# Patient Record
Sex: Female | Born: 1980 | Race: Black or African American | Hispanic: No | Marital: Single | Smoking: Current every day smoker
Health system: Southern US, Community
[De-identification: ages and names within clinical notes are randomized; demographics above are authoritative.]

## PROBLEM LIST (undated history)

## (undated) DIAGNOSIS — E119 Type 2 diabetes mellitus without complications: Secondary | ICD-10-CM

## (undated) DIAGNOSIS — C50919 Malignant neoplasm of unspecified site of unspecified female breast: Secondary | ICD-10-CM

## (undated) DIAGNOSIS — I10 Essential (primary) hypertension: Secondary | ICD-10-CM

## (undated) HISTORY — PX: TUBAL LIGATION: SHX77

## (undated) HISTORY — PX: MASTECTOMY: SHX3

---

## 2005-10-28 HISTORY — PX: KNEE SURGERY: SHX244

## 2014-03-25 ENCOUNTER — Ambulatory Visit: Payer: Self-pay | Admitting: Physician Assistant

## 2014-03-25 LAB — URINALYSIS, COMPLETE
Bilirubin,UR: NEGATIVE
Glucose,UR: NEGATIVE mg/dL (ref 0–75)
Ketone: NEGATIVE
LEUKOCYTE ESTERASE: NEGATIVE
NITRITE: NEGATIVE
PH: 7 (ref 4.5–8.0)
Protein: 300
SPECIFIC GRAVITY: 1.02 (ref 1.003–1.030)

## 2017-11-10 ENCOUNTER — Other Ambulatory Visit: Payer: Self-pay

## 2017-11-10 ENCOUNTER — Ambulatory Visit
Admission: EM | Admit: 2017-11-10 | Discharge: 2017-11-10 | Disposition: A | Discharge status: Home / Self Care | Payer: BLUE CROSS/BLUE SHIELD | Attending: Family Medicine | Admitting: Family Medicine

## 2017-11-10 DIAGNOSIS — M65311 Trigger thumb, right thumb: Secondary | ICD-10-CM | POA: Diagnosis not present

## 2017-11-10 DIAGNOSIS — M65949 Unspecified synovitis and tenosynovitis, unspecified hand: Secondary | ICD-10-CM

## 2017-11-10 DIAGNOSIS — M659 Synovitis and tenosynovitis, unspecified: Secondary | ICD-10-CM | POA: Diagnosis not present

## 2017-11-10 HISTORY — DX: Malignant neoplasm of unspecified site of unspecified female breast: C50.919

## 2017-11-10 HISTORY — DX: Essential (primary) hypertension: I10

## 2017-11-10 HISTORY — DX: Type 2 diabetes mellitus without complications: E11.9

## 2017-11-10 NOTE — Discharge Instructions (Signed)
Follow up with orthopedist for possible injection

## 2017-11-10 NOTE — ED Triage Notes (Signed)
Patient complains of right thumb pain that started 4 weeks. Patient states that initially it was just a popping sensation that did not cause pain. Patient reports that 1.5 weeks ago her thumb started not being able to lay flat, she can force it but causes extreme pain. Patient states that she is also noticing a burning sensation in her thumb that has been constant.

## 2017-11-10 NOTE — ED Provider Notes (Signed)
[facility]    CSN: [id number] Arrival date & time: 11/10/17  1055     History   Chief Complaint Chief Complaint  Patient presents with  . Finger Injury    right thumb pain    HPI Deanna Swanson is a 37 y.o. female.   37 yo female with a c/o progressively worsening right thumb pain and thumb "getting stuck". States she has to pull or push the thumb joint to get it unstuck but this is painful. Denies any falls or other traumatic injury, fevers, chills.     The history is provided by the patient.    Past Medical History:  Diagnosis Date  . Breast cancer ([place])    left  . Diabetes ([place])   . Hypertension     There are no active problems to display for this patient.   Past Surgical History:  Procedure Laterality Date  . KNEE SURGERY Left 2007  . MASTECTOMY Bilateral   . TUBAL LIGATION      OB History    No data available       Home Medications    Prior to Admission medications   Medication Sig Start Date End Date Taking? Authorizing Provider  amLODipine (NORVASC) 5 MG tablet Take 5 mg by mouth daily.   Yes [provider]  atorvastatin (LIPITOR) 10 MG tablet Take 10 mg by mouth daily.   Yes [provider]  fenofibrate (TRICOR) 145 MG tablet Take 145 mg by mouth daily.   Yes [provider]  insulin detemir (LEVEMIR) 100 UNIT/ML injection Inject 75 Units into the skin 2 (two) times daily.   Yes [provider]    Family History Family History  Problem Relation Age of Onset  . Breast cancer Mother   . Diabetes Father   . Pancreatic cancer Father     Social History Social History   Tobacco Use  . Smoking status: Never Smoker  . Smokeless tobacco: Never Used  Substance Use Topics  . Alcohol use: Yes    Comment: very rare  . Drug use: No     Allergies   Ivp dye [iodinated diagnostic agents]; Metformin and related; Shellfish allergy; and Strawberry (diagnostic)   Review of Systems Review of  Systems   Physical Exam Triage Vital Signs ED Triage Vitals  Enc Vitals Group     BP 11/10/17 1121 (!) 153/84     Pulse Rate 11/10/17 1121 89     Resp 11/10/17 1121 18     Temp 11/10/17 1121 98.6 F (37 C)     Temp Source 11/10/17 1121 Oral     SpO2 11/10/17 1121 99 %     Weight 11/10/17 1115 245 lb (111.1 kg)     Height 11/10/17 1115 5\' 10"  (1.778 m)     Head Circumference --      Peak Flow --      Pain Score 11/10/17 1115 7     Pain Loc --      Pain Edu? --      Excl. in [place]? --    No data found.  Updated Vital Signs BP (!) 153/84 (BP Location: Right Arm)   Pulse 89   Temp 98.6 F (37 C) (Oral)   Resp 18   Ht 5\' 10"  (1.778 m)   Wt 245 lb (111.1 kg)   LMP 11/08/2017   SpO2 99%   BMI 35.15 kg/m   Visual Acuity Right Eye Distance:   Left Eye  Distance:   Bilateral Distance:    Right Eye Near:   Left Eye Near:    Bilateral Near:     Physical Exam  Constitutional: She appears well-developed and well-nourished. No distress.  Musculoskeletal:       Right hand: She exhibits decreased range of motion, tenderness and swelling. She exhibits no bony tenderness, normal two-point discrimination, normal capillary refill, no deformity and no laceration. Normal sensation noted. Normal strength noted.  Trigger finger of right thumb  Skin: She is not diaphoretic.  Nursing note and vitals reviewed.    UC Treatments / Results  Labs (all labs ordered are listed, but only abnormal results are displayed) Labs Reviewed - No data to display  EKG  EKG Interpretation None       Radiology No results found.  Procedures Procedures (including critical care time)  Medications Ordered in UC Medications - No data to display   Initial Impression / Assessment and Plan / UC Course  I have reviewed the triage vital signs and the nursing notes.  Pertinent labs & imaging results that were available during my care of the patient were reviewed by me and considered in my medical  decision making (see chart for details).       Final Clinical Impressions(s) / UC Diagnoses   Final diagnoses:  Tenosynovitis of thumb  Trigger finger of right thumb    ED Discharge Orders    None     1. diagnosis reviewed with patient 2. Recommend supportive treatment with otc NSAIDs, thumb spica splint for support 3. Follow up with orthopedist for further evaluation and management  4. Follow-up prn if symptoms worsen or don't improve  Controlled Substance Prescriptions [place] Controlled Substance Registry consulted? Not Applicable   Norval Gable, MD 11/10/17 1215

## 2017-11-13 ENCOUNTER — Telehealth: Payer: Self-pay | Admitting: Emergency Medicine

## 2017-11-13 NOTE — Telephone Encounter (Signed)
Called to follow up after patient's recent visit. Patient states she is doing much better. She did go to [facility] and got a joint injection.

## 2020-12-21 ENCOUNTER — Ambulatory Visit: Payer: BC Managed Care – PPO

## 2020-12-21 ENCOUNTER — Ambulatory Visit
Admission: EM | Admit: 2020-12-21 | Discharge: 2020-12-21 | Disposition: A | Discharge status: Home / Self Care | Payer: BC Managed Care – PPO | Attending: Physician Assistant | Admitting: Physician Assistant

## 2020-12-21 ENCOUNTER — Ambulatory Visit (INDEPENDENT_AMBULATORY_CARE_PROVIDER_SITE_OTHER): Payer: BC Managed Care – PPO

## 2020-12-21 ENCOUNTER — Other Ambulatory Visit: Payer: Self-pay

## 2020-12-21 ENCOUNTER — Encounter: Payer: Self-pay | Admitting: Emergency Medicine

## 2020-12-21 DIAGNOSIS — R102 Pelvic and perineal pain: Secondary | ICD-10-CM | POA: Diagnosis present

## 2020-12-21 DIAGNOSIS — M25552 Pain in left hip: Secondary | ICD-10-CM | POA: Insufficient documentation

## 2020-12-21 DIAGNOSIS — N760 Acute vaginitis: Secondary | ICD-10-CM | POA: Insufficient documentation

## 2020-12-21 DIAGNOSIS — M545 Low back pain, unspecified: Secondary | ICD-10-CM | POA: Diagnosis not present

## 2020-12-21 DIAGNOSIS — B9689 Other specified bacterial agents as the cause of diseases classified elsewhere: Secondary | ICD-10-CM | POA: Insufficient documentation

## 2020-12-21 LAB — URINALYSIS, COMPLETE (UACMP) WITH MICROSCOPIC
Bilirubin Urine: NEGATIVE
Glucose, UA: NEGATIVE mg/dL
Hgb urine dipstick: NEGATIVE
Ketones, ur: NEGATIVE mg/dL
Leukocytes,Ua: NEGATIVE
Nitrite: NEGATIVE
Protein, ur: >300 mg/dL — AB
RBC / HPF: NONE SEEN RBC/hpf (ref 0–5)
Specific Gravity, Urine: >1.03 — ABNORMAL HIGH (ref 1.005–1.030)
pH: 6 (ref 5.0–8.0)

## 2020-12-21 LAB — WET PREP, GENITAL
Sperm: NONE SEEN
Trich, Wet Prep: NONE SEEN
WBC, Wet Prep HPF POC: NONE SEEN
Yeast Wet Prep HPF POC: NONE SEEN

## 2020-12-21 MED ORDER — DICLOFENAC SODIUM 75 MG PO TBEC: 75.0000 mg | DELAYED_RELEASE_TABLET | Freq: Two times a day (BID) | ORAL | 0 refills | Status: AC

## 2020-12-21 MED ORDER — KETOROLAC TROMETHAMINE 60 MG/2ML IM SOLN
60.0000 mg | Freq: Once | INTRAMUSCULAR | Status: AC
  Administered 2020-12-21: 60 mg via INTRAMUSCULAR

## 2020-12-21 MED ORDER — METRONIDAZOLE 500 MG PO TABS: 500.0000 mg | ORAL_TABLET | Freq: Two times a day (BID) | ORAL | 0 refills | Status: AC

## 2020-12-21 NOTE — Discharge Instructions (Addendum)
The x-ray of your back was normal.  The x-ray of your hip does show that you have some mild arthritis in the hip.  This could be the cause of your pain.  It is also possible that the pain could be originating from your back or your sacroiliac joint.  They are treated similarly with anti-inflammatory medication, Tylenol for pain relief, stretches and if not getting better over the next couple weeks or if you have any worsening symptoms, following up with orthopedics.  BACK PAIN: Stressed avoiding painful activities . RICE (REST, ICE, COMPRESSION, ELEVATION) guidelines reviewed. May alternate ice and heat. Consider use of muscle rubs, Salonpas patches, etc. Use medications as directed including muscle relaxers if prescribed. Take anti-inflammatory medications as prescribed or OTC NSAIDs/Tylenol.  F/u with PCP in 7-10 days for reexamination, and please feel free to call or return to the urgent care at any time for any questions or concerns you may have and we will be happy to help you!   BACK PAIN RED FLAGS: If the back pain acutely worsens or there are any red flag symptoms such as numbness/tingling, leg weakness, saddle anesthesia, or loss of bowel/bladder control, go immediately to the ER. Follow up with [place] as scheduled or sooner if the pain does not begin to resolve or if it worsens before the follow up    You may have a condition requiring you to follow up with Orthopedics so please call one of the following office for appointment:   [facility] [address] [place], [place] [address] Phone: [phone]  [facility] [address] [place], [place] [address] Phone: [phone]

## 2020-12-21 NOTE — ED Triage Notes (Addendum)
Patient in today c/o left hip pain x 4 days. Patient states the pain is now in her pelvis also. No injury noted. Patient has taken OTC Aleve, Ibuprofen and Tylenol. Patient states Aleve did help a little bit, but she took 3 Aleve at one time. Patient states that she felt like she was taking too much medicine and hasn't taken anything in 2 days. Patient states pain is much worse with ambulation.

## 2020-12-21 NOTE — ED Provider Notes (Signed)
[facility]    CSN: [id number] Arrival date & time: 12/21/20  9450      History   Chief Complaint Chief Complaint  Patient presents with  . Hip Pain  . Pelvic Pain    HPI Deanna Swanson is a 40 y.o. female presenting with 4-day history of left-sided lower back pain with radiation to the left buttocks and left groin region.  Patient says that the pain is constant and aching.  She says that with certain movements she will have a sharp pain that is worse in the groin area.  She says walking improves the pain and sitting for period time seems to make it worse.  Admits to increased pain when lying on back and externally rotating the hip.  Also admits to occasional increased pain with twisting of the hip.  No associated numbness, weakness or tingling.  She denies any injury or trauma.  Patient denies any chronic problems of her hip.  She says that she does have chronic back pain is generally mild.  Patient has taken Aleve and says that that did improve the pain.  She denies any associated fever, fatigue, abdominal pain, vaginal discharge, dysuria or urinary frequency.  No concern for STIs and no concern for pregnancy as she has had tubal ligation.  Her past medical history is significant for diabetes, hypertension and breast cancer.  Patient has no other complaints or concerns.  HPI  Past Medical History:  Diagnosis Date  . Breast cancer ([place])    left  . Diabetes ([place])   . Hypertension     There are no problems to display for this patient.   Past Surgical History:  Procedure Laterality Date  . KNEE SURGERY Left 2007  . MASTECTOMY Bilateral   . TUBAL LIGATION      OB History   No obstetric history on file.      Home Medications    Prior to Admission medications   Medication Sig Start Date End Date Taking? Authorizing Provider  albuterol (VENTOLIN HFA) 108 (90 Base) MCG/ACT inhaler INHALE 2 PUFFS BY MOUTH EVERY 6 HOURS AS NEEDED FOR WHEEZING 09/26/20  Yes  [provider]  amLODipine (NORVASC) 5 MG tablet Take 5 mg by mouth daily.   Yes [provider]  atorvastatin (LIPITOR) 10 MG tablet Take 10 mg by mouth daily.   Yes [provider]  Continuous Blood Gluc Sensor (DEXCOM G6 SENSOR) MISC SMARTSIG:1 Topical Every 10 Days 10/17/20  Yes [provider]  diclofenac (VOLTAREN) 75 MG EC tablet Take 1 tablet (75 mg total) by mouth 2 (two) times daily for 15 days. 12/21/20 01/05/21 Yes Danton Clap, PA-C  empagliflozin (JARDIANCE) 25 MG TABS tablet Take 25 mg by mouth daily.   Yes [provider]  fenofibrate (TRICOR) 145 MG tablet Take 145 mg by mouth daily.   Yes [provider]  FLOVENT HFA 110 MCG/ACT inhaler prn 09/26/20  Yes [provider]  insulin detemir (LEVEMIR) 100 UNIT/ML injection Inject 75 Units into the skin 2 (two) times daily.   Yes [provider]  lisinopril (ZESTRIL) 5 MG tablet Take 5 mg by mouth daily. 08/11/20  Yes [provider]  metroNIDAZOLE (FLAGYL) 500 MG tablet Take 1 tablet (500 mg total) by mouth 2 (two) times daily for 7 days. 12/21/20 12/28/20 Yes Danton Clap, PA-C  VICTOZA 18 MG/3ML SOPN SMARTSIG:0.2 Milliliter(s) SUB-Q Daily 12/04/20  Yes [provider]    Family History Family History  Problem Relation Age of Onset  . Breast cancer Mother   . Diabetes Father   . Pancreatic cancer Father     Social History Social History   Tobacco Use  . Smoking status: Current Every Day Smoker  . Smokeless tobacco: Never Used  . Tobacco comment: 3 cig/day  Vaping Use  . Vaping Use: Never used  Substance Use Topics  . Alcohol use: Yes    Comment: very rare  . Drug use: No     Allergies   Ivp dye [iodinated diagnostic agents], Metformin and related, Shellfish allergy, Strawberry (diagnostic), and Tegaderm alginate ag rope   Review of Systems Review of Systems  Constitutional: Negative for fatigue and fever.   Gastrointestinal: Negative for abdominal pain, nausea and vomiting.  Genitourinary: Positive for pelvic pain. Negative for dysuria, flank pain, hematuria and vaginal discharge.  Musculoskeletal: Positive for arthralgias (left hip) and back pain. Negative for gait problem and joint swelling.  Skin: Negative for wound.  Neurological: Negative for weakness and numbness.     Physical Exam Triage Vital Signs ED Triage Vitals  Enc Vitals Group     BP 12/21/20 0841 (!) 165/100     Pulse Rate 12/21/20 0841 100     Resp 12/21/20 0841 18     Temp 12/21/20 0841 98.7 F (37.1 C)     Temp Source 12/21/20 0841 Oral     SpO2 12/21/20 0841 100 %     Weight 12/21/20 0841 240 lb (108.9 kg)     Height 12/21/20 0841 5\' 11"  (1.803 m)     Head Circumference --      Peak Flow --      Pain Score 12/21/20 0840 3     Pain Loc --      Pain Edu? --      Excl. in [place]? --    No data found.  Updated Vital Signs BP (!) 159/100 (BP Location: Left Arm)   Pulse 100   Temp 98.7 F (37.1 C) (Oral)   Resp 18   Ht 5\' 11"  (1.803 m)   Wt 240 lb (108.9 kg)   LMP 12/07/2020 (Approximate) Comment: denies pref. fallopian tubes removed  SpO2 100%   BMI 33.47 kg/m       Physical Exam Vitals and nursing note reviewed.  Constitutional:      General: She is not in acute distress.    Appearance: Normal appearance. She is not ill-appearing or toxic-appearing.  HENT:     Head: Normocephalic and atraumatic.  Eyes:     General: No scleral icterus.       Right eye: No discharge.        Left eye: No discharge.     Conjunctiva/sclera: Conjunctivae normal.  Cardiovascular:     Rate and Rhythm: Normal rate and regular rhythm.     Heart sounds: Normal heart sounds.  Pulmonary:     Effort: Pulmonary effort is normal. No respiratory distress.     Breath sounds: Normal breath sounds.  Abdominal:     Palpations: Abdomen is soft.     Tenderness: There is no abdominal tenderness. There is no right CVA tenderness,  left CVA tenderness or guarding.  Musculoskeletal:     Cervical back: Neck supple.     Left hip: Tenderness (TTP left SI joint, mildly of left paralumbar muscles, L sciatic notch) present. No deformity. Decreased range of motion (with internal and external ROM due to pain and guarding). Normal strength.  Comments: Painful external rotation of hip  Skin:    General: Skin is dry.  Neurological:     General: No focal deficit present.     Mental Status: She is alert. Mental status is at baseline.     Motor: No weakness.     Gait: Gait normal.  Psychiatric:        Mood and Affect: Mood normal.        Behavior: Behavior normal.        Thought Content: Thought content normal.      UC Treatments / Results  Labs (all labs ordered are listed, but only abnormal results are displayed) Labs Reviewed  WET PREP, GENITAL - Abnormal; Notable for the following components:      Result Value   Clue Cells Wet Prep HPF POC PRESENT (*)    All other components within normal limits  URINALYSIS, COMPLETE (UACMP) WITH MICROSCOPIC - Abnormal; Notable for the following components:   APPearance HAZY (*)    Specific Gravity, Urine >1.030 (*)    Protein, ur >300 (*)    Bacteria, UA FEW (*)    All other components within normal limits    EKG   Radiology DG Lumbar Spine Complete  Result Date: 12/21/2020 CLINICAL DATA:  Acute low back pain. EXAM: LUMBAR SPINE - COMPLETE 4+ VIEW COMPARISON:  None. FINDINGS: There is no evidence of lumbar spine fracture. Alignment is normal. Intervertebral disc spaces are maintained. IMPRESSION: Negative. Electronically Signed   By: Marijo Conception M.D.   On: 12/21/2020 10:20   DG Hip Unilat W or Wo Pelvis 2-3 Views Left  Result Date: 12/21/2020 CLINICAL DATA:  Acute left hip pain. EXAM: DG HIP (WITH OR WITHOUT PELVIS) 2-3V LEFT COMPARISON:  None. FINDINGS: There is no evidence of hip fracture or dislocation. Mild osteophyte formation is noted in the left hip.  IMPRESSION: Mild degenerative joint disease is noted. No acute abnormality is noted. Electronically Signed   By: Marijo Conception M.D.   On: 12/21/2020 10:21    Procedures Procedures (including critical care time)  Medications Ordered in UC Medications  ketorolac (TORADOL) injection 60 mg (60 mg Intramuscular Given 12/21/20 1010)    Initial Impression / Assessment and Plan / UC Course  I have reviewed the triage vital signs and the nursing notes.  Pertinent labs & imaging results that were available during my care of the patient were reviewed by me and considered in my medical decision making (see chart for details).   40 year old female presenting for left-sided lower back pain, left hip pain and left groin pain.  Pain exacerbated by movement and improved with light rest.  Pain also improved with anti-inflammatory medication.  Exam significant for tenderness of the SI joint, left paralumbar muscles and left sciatic notch.  No tenderness of the abdomen.  Painful external rotation of hip on mild pain with flexion of spine.  Chest clear to auscultation heart regular rate and rhythm.  X-rays of left hip/pelvis and L-spine obtained today due to suspicion for possible hip arthropathy and possible degenerative disc disease of the L-spine.  Urinalysis and wet prep also obtained due to complaint of pain in the groin area although she denies dysuria or vaginal discharge.  Wet prep shows bacterial vaginosis.  Will treat with metronidazole, but unlikely this was the source of her pain.  UA does show protein and her blood pressure is elevated at 159/100.  Advised to follow-up with PCP to get tighter control of her  blood pressure and lab work performed.  X-ray of L-spine is within normal limits.  The x-ray of the hip does show some mild osteoarthritis present.  Discussed results with patient.  Discussed multiple different possibilities for pain including osteoarthritis of the hip, SI joint arthropathy, or  lumbar disease causing referred pain to the hip.  Advised supportive care at this time with NSAIDs, Tylenol, RICE, stretches, and heat or ice as needed for more pain relief.  She is advised to follow-up with Ortho if not getting better in the next couple of weeks or for any worsening symptoms.  Patient declined work note.   Final Clinical Impressions(s) / UC Diagnoses   Final diagnoses:  Left hip pain  Acute bilateral low back pain, unspecified whether sciatica present  Pelvic pain in female  Bacterial vaginosis     Discharge Instructions     The x-ray of your back was normal.  The x-ray of your hip does show that you have some mild arthritis in the hip.  This could be the cause of your pain.  It is also possible that the pain could be originating from your back or your sacroiliac joint.  They are treated similarly with anti-inflammatory medication, Tylenol for pain relief, stretches and if not getting better over the next couple weeks or if you have any worsening symptoms, following up with orthopedics.  BACK PAIN: Stressed avoiding painful activities . RICE (REST, ICE, COMPRESSION, ELEVATION) guidelines reviewed. May alternate ice and heat. Consider use of muscle rubs, Salonpas patches, etc. Use medications as directed including muscle relaxers if prescribed. Take anti-inflammatory medications as prescribed or OTC NSAIDs/Tylenol.  F/u with PCP in 7-10 days for reexamination, and please feel free to call or return to the urgent care at any time for any questions or concerns you may have and we will be happy to help you!   BACK PAIN RED FLAGS: If the back pain acutely worsens or there are any red flag symptoms such as numbness/tingling, leg weakness, saddle anesthesia, or loss of bowel/bladder control, go immediately to the ER. Follow up with [place] as scheduled or sooner if the pain does not begin to resolve or if it worsens before the follow up    You may have a condition requiring you to follow  up with Orthopedics so please call one of the following office for appointment:   [facility] [address] [place], [place] [address] Phone: [phone]  [facility] [address] [place], [place] [address] Phone: [phone]     ED Prescriptions    Medication Sig Dispense Auth. Provider   diclofenac (VOLTAREN) 75 MG EC tablet Take 1 tablet (75 mg total) by mouth 2 (two) times daily for 15 days. 30 tablet Laurene Footman B, PA-C   metroNIDAZOLE (FLAGYL) 500 MG tablet Take 1 tablet (500 mg total) by mouth 2 (two) times daily for 7 days. 14 tablet Gretta Cool     PDMP not reviewed this encounter.   Danton Clap, PA-C 12/21/20 1108

## 2022-10-23 IMAGING — CR DG HIP (WITH OR WITHOUT PELVIS) 2-3V*L*
3 series · 3 of 3 positions shown · non-contrast
Comparison: None.

CLINICAL DATA: Acute left hip pain.

EXAM:
DG HIP (WITH OR WITHOUT PELVIS) 2-3V LEFT

[pelvis ap]
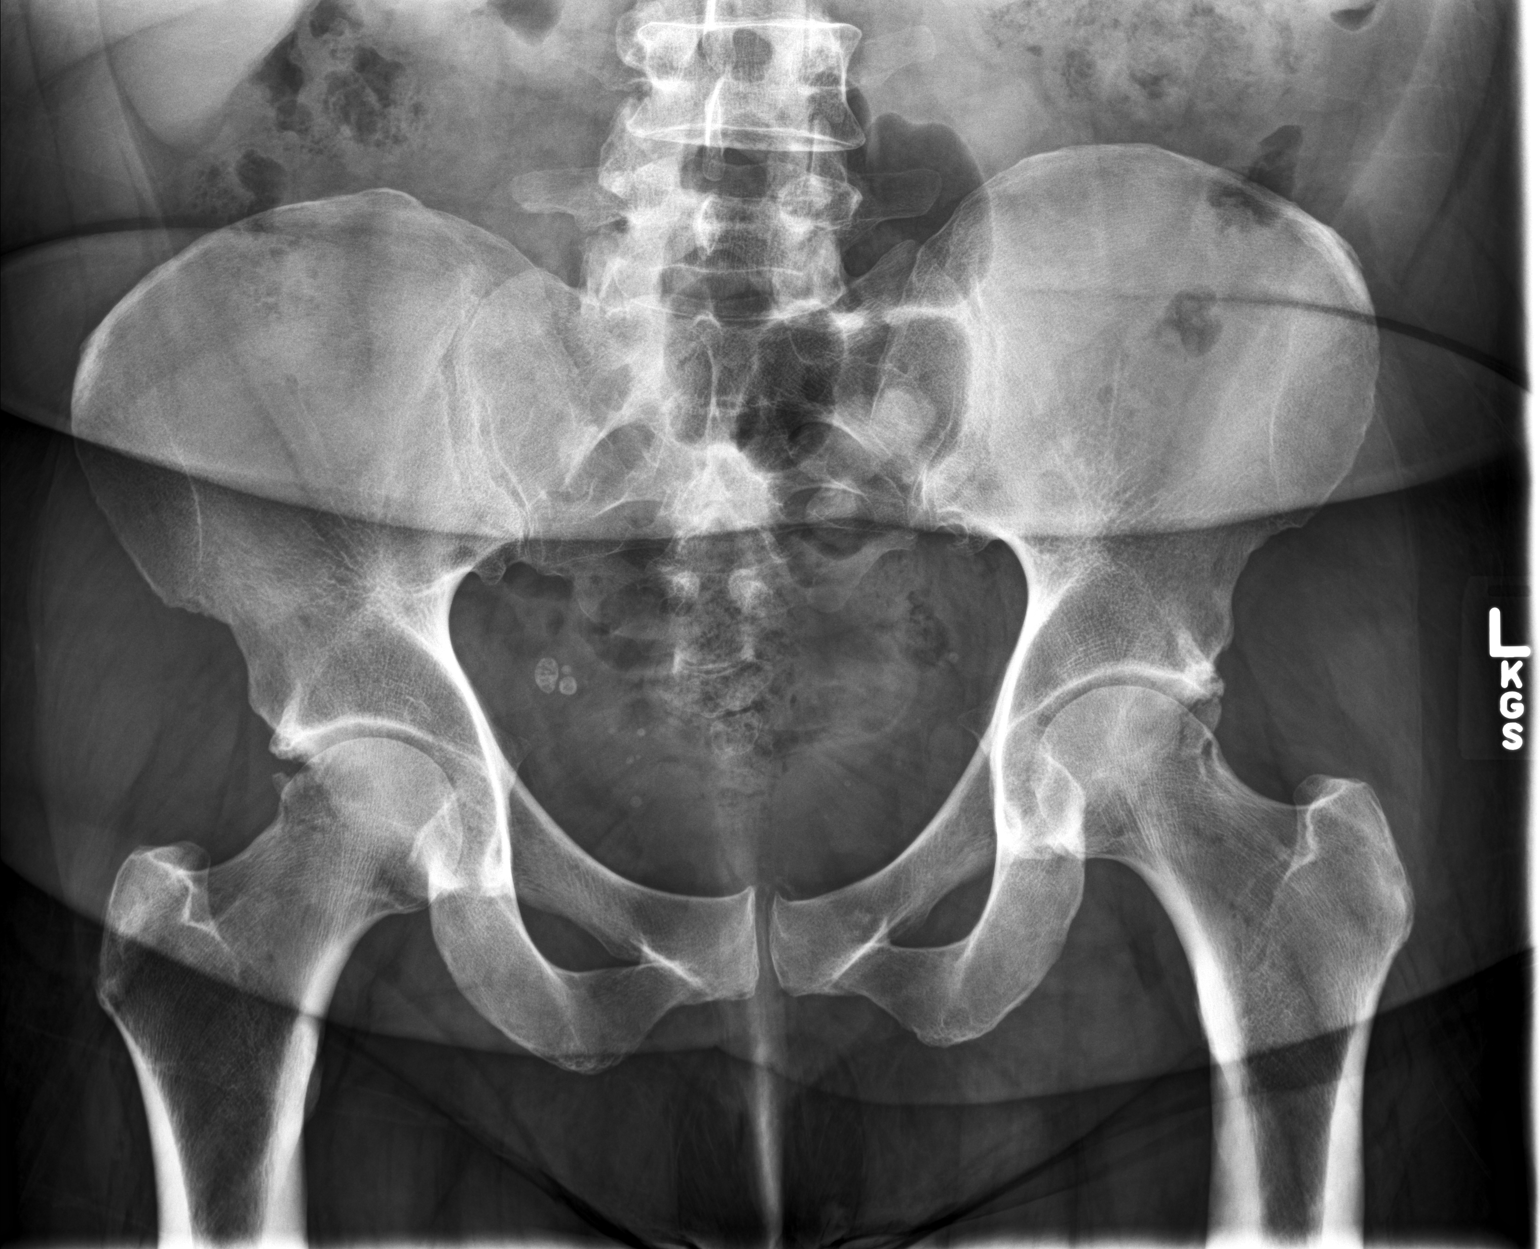

[hip ap]
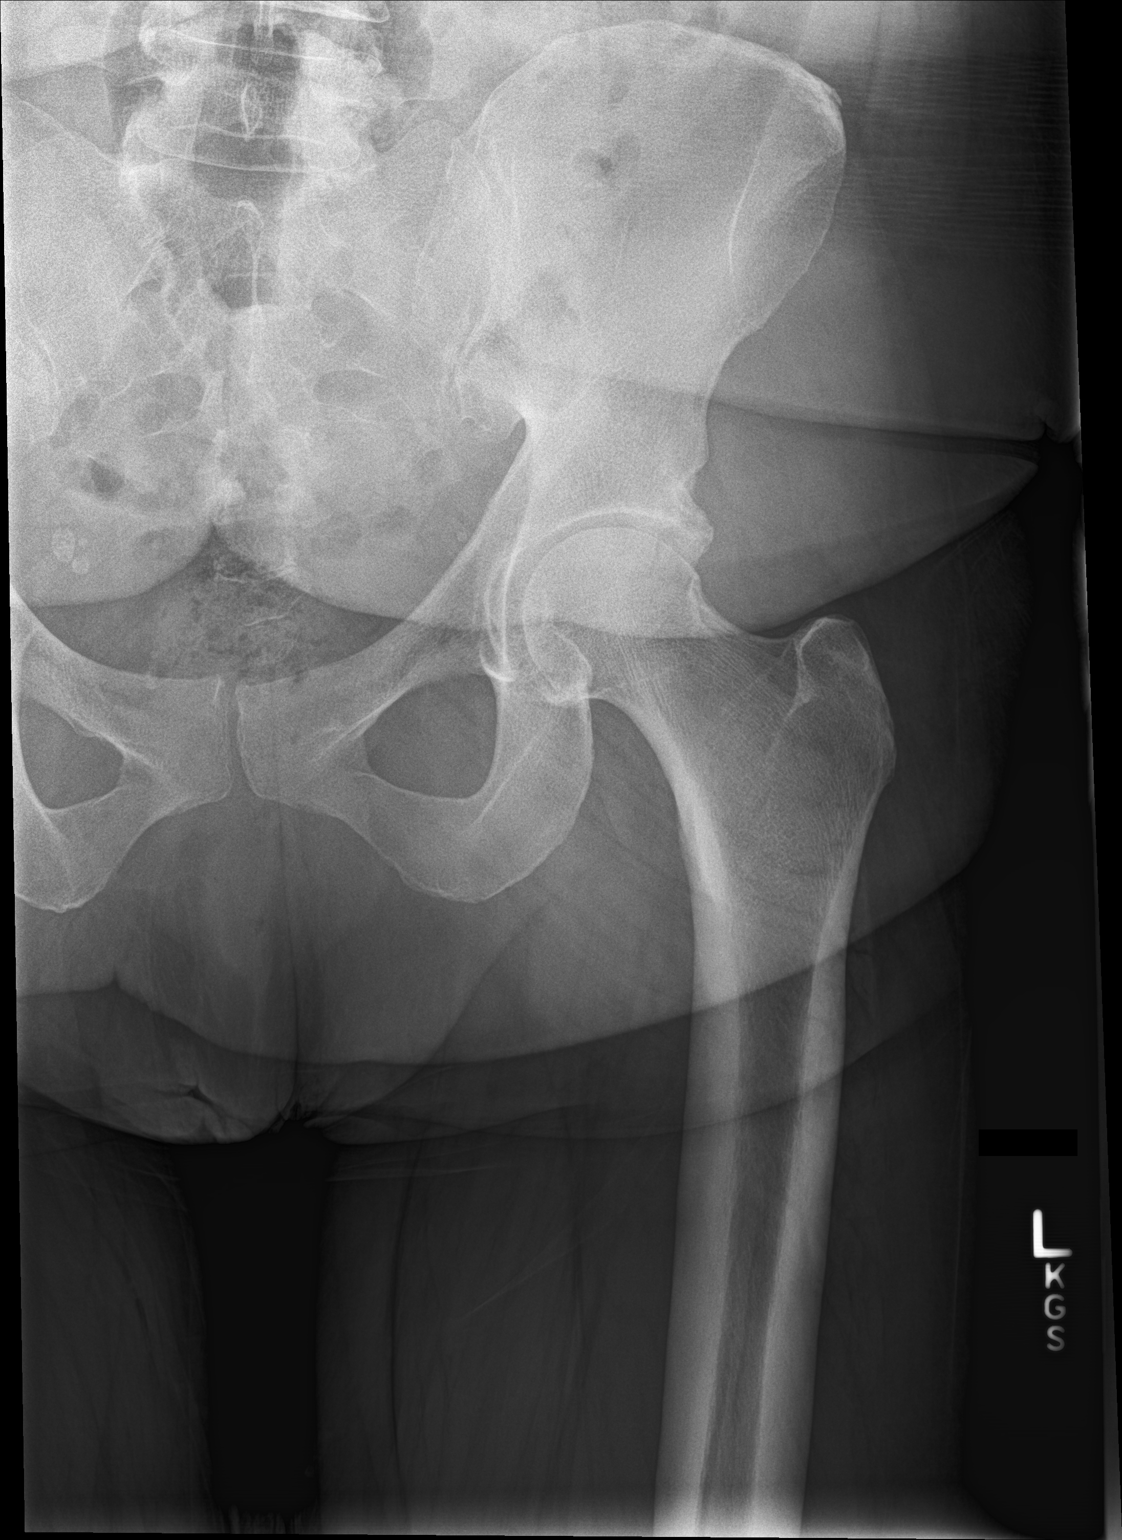

[hip lat]
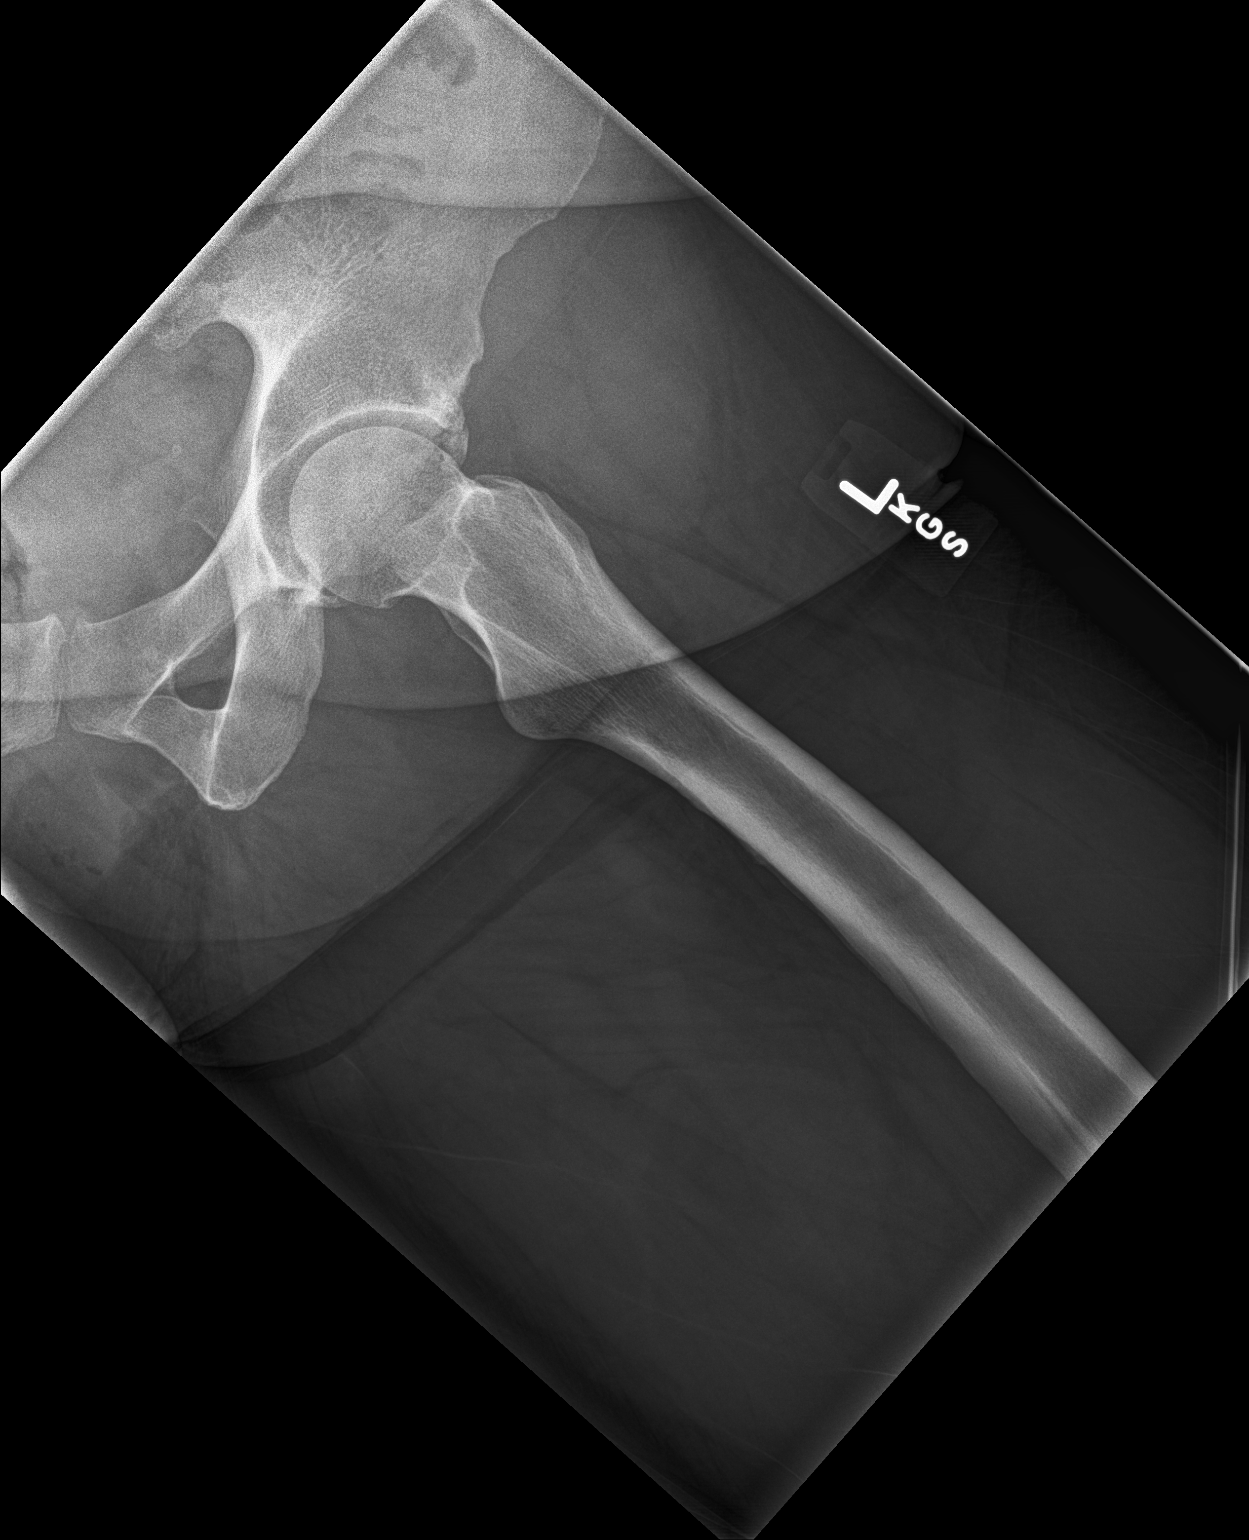

[3 of 3 positions shown; findings below may reference images not displayed]

FINDINGS: There is no evidence of hip fracture or dislocation. Mild osteophyte
formation is noted in the left hip.
IMPRESSION: Mild degenerative joint disease is noted. No acute abnormality is
noted.
